# Patient Record
Sex: Male | Born: 1990 | Race: White | Hispanic: No | Marital: Single | Smoking: Never smoker
Health system: Southern US, Community
[De-identification: ages and names within clinical notes are randomized; demographics above are authoritative.]

## PROBLEM LIST (undated history)

## (undated) DIAGNOSIS — R630 Anorexia: Secondary | ICD-10-CM

## (undated) DIAGNOSIS — I10 Essential (primary) hypertension: Secondary | ICD-10-CM

## (undated) DIAGNOSIS — F419 Anxiety disorder, unspecified: Secondary | ICD-10-CM

## (undated) HISTORY — DX: Anxiety disorder, unspecified: F41.9

## (undated) HISTORY — DX: Essential (primary) hypertension: I10

## (undated) HISTORY — DX: Anorexia: R63.0

---

## 2018-10-27 ENCOUNTER — Ambulatory Visit: Payer: Self-pay | Admitting: Family Medicine

## 2019-01-17 ENCOUNTER — Other Ambulatory Visit: Payer: Self-pay

## 2019-01-17 ENCOUNTER — Encounter: Payer: Self-pay | Admitting: Emergency Medicine

## 2019-01-17 ENCOUNTER — Ambulatory Visit (INDEPENDENT_AMBULATORY_CARE_PROVIDER_SITE_OTHER): Payer: Managed Care, Other (non HMO) | Admitting: Emergency Medicine

## 2019-01-17 VITALS — BP 115/73 | HR 75 | Temp 99.2°F | Resp 16 | Ht 75.5 in | Wt 202.8 lb

## 2019-01-17 DIAGNOSIS — F411 Generalized anxiety disorder: Secondary | ICD-10-CM

## 2019-01-17 NOTE — Progress Notes (Addendum)
Pedro Ramirez 28 y.o.   Chief Complaint  Patient presents with  . Establish Care  . Medication Refill    Xanax prescribed by Pedro Ramirez in [facility], per patient started medication at the age of 28 years old    HISTORY OF PRESENT ILLNESS: This is a 28 y.o. male with a history of generalized anxiety disorder since age 32, here to establish care.  [place] the state.  Was being treated by primary care physician, Pedro.Borges, up in [place].  Last time he saw a psychiatrist he was 28 years old.  Presently taking venlafaxine and alprazolam 1 mg every 8 hours.  Denies any other chronic medical problems.   HPI   Prior to Admission medications   Medication Sig Start Date End Date Taking? Authorizing Provider  ALPRAZolam Pedro Ramirez) 1 MG tablet Take 1 mg by mouth at bedtime as needed for anxiety.   Yes [provider]  lisinopril (ZESTRIL) 20 MG tablet Take 20 mg by mouth daily.   Yes [provider]  venlafaxine XR (EFFEXOR-XR) 150 MG 24 hr capsule Take 150 mg by mouth daily with breakfast.   Yes [provider]    Not on File  There are no active problems to display for this patient.   Past Medical History:  Diagnosis Date  . Anorexia    as a youth  . Anxiety   . Hypertension     History reviewed. No pertinent surgical history.  Social History   Socioeconomic History  . Marital status: Single    Spouse name: Not on file  . Number of children: Not on file  . Years of education: Not on file  . Highest education level: Not on file  Occupational History  . Not on file  Social Needs  . Financial resource strain: Not on file  . Food insecurity    Worry: Not on file    Inability: Not on file  . Transportation needs    Medical: Not on file    Non-medical: Not on file  Tobacco Use  . Smoking status: Never Smoker  . Smokeless tobacco: Never Used  Substance and Sexual Activity  . Alcohol use: Not Currently  . Drug use: Never  .  Sexual activity: Not on file  Lifestyle  . Physical activity    Days per week: Not on file    Minutes per session: Not on file  . Stress: Not on file  Relationships  . Social Herbalist on phone: Not on file    Gets together: Not on file    Attends religious service: Not on file    Active member of club or organization: Not on file    Attends meetings of clubs or organizations: Not on file    Relationship status: Not on file  . Intimate partner violence    Fear of current or ex partner: Not on file    Emotionally abused: Not on file    Physically abused: Not on file    Forced sexual activity: Not on file  Other Topics Concern  . Not on file  Social History Narrative  . Not on file    Family History  Problem Relation Age of Onset  . Cancer Mother        thyroid  . Hyperlipidemia Father   . Hypertension Father   . Mental illness Father   . Mental illness Paternal Grandmother      Review of Systems  Constitutional: Negative.  HENT: Negative.   Eyes: Negative.   Respiratory: Negative.  Negative for shortness of breath.   Cardiovascular: Negative.  Negative for palpitations.  Gastrointestinal: Negative.  Negative for diarrhea.  Genitourinary: Negative.   Musculoskeletal: Negative.   Skin: Negative.   Neurological: Negative.  Negative for dizziness.  Endo/Heme/Allergies: Negative.   All other systems reviewed and are negative.  Vitals:   01/17/19 1447  BP: 115/73  Pulse: 75  Resp: 16  Temp: 99.2 F (37.3 C)  SpO2: 98%     Physical Exam Vitals signs reviewed.  Constitutional:      Appearance: Normal appearance.  HENT:     Head: Normocephalic and atraumatic.  Neck:     Musculoskeletal: Normal range of motion.  Cardiovascular:     Rate and Rhythm: Normal rate and regular rhythm.     Heart sounds: Normal heart sounds.  Pulmonary:     Effort: Pulmonary effort is normal.  Musculoskeletal: Normal range of motion.  Skin:    General: Skin is  warm and dry.     Capillary Refill: Capillary refill takes less than 2 seconds.  Neurological:     General: No focal deficit present.     Mental Status: He is alert and oriented to person, place, and time.  Psychiatric:        Mood and Affect: Mood normal.        Behavior: Behavior normal.      ASSESSMENT & PLAN: Pedro Ramirez was seen today for establish care and medication refill.  Diagnoses and all orders for this visit:  Generalized anxiety disorder -     Ambulatory referral to Psychiatry  Other orders -     Cancel: Tdap vaccine greater than or equal to 7yo IM    Patient Instructions       If you have lab work done today you will be contacted with your lab results within the next 2 weeks.  If you have not heard from us then please contact us. The fastest way to get your results is to register for My Chart.   IF you received an x-ray today, you will receive an invoice from [facility]. Please contact [facility] at [phone] with questions or concerns regarding your invoice.   IF you received labwork today, you will receive an invoice from [facility]. Please contact [facility] at [phone] with questions or concerns regarding your invoice.   Our billing staff will not be able to assist you with questions regarding bills from these companies.  You will be contacted with the lab results as soon as they are available. The fastest way to get your results is to activate your My Chart account. Instructions are located on the last page of this paperwork. If you have not heard from us regarding the results in 2 weeks, please contact this office.     Living With Anxiety  After being diagnosed with an anxiety disorder, you may be relieved to know why you have felt or behaved a certain way. It is natural to also feel overwhelmed about the treatment ahead and what it will mean for your life. With care and support, you can manage this condition and recover from it.  How to cope with anxiety Dealing with stress Stress is your body's reaction to life changes and events, both good and bad. Stress can last just a few hours or it can be ongoing. Stress can play a major role in anxiety, so it is important to learn both how to cope  with stress and how to think about it differently. Talk with your health care provider or a counselor to learn more about stress reduction. He or she may suggest some stress reduction techniques, such as:  Music therapy. This can include creating or listening to music that you enjoy and that inspires you.  Mindfulness-based meditation. This involves being aware of your normal breaths, rather than trying to control your breathing. It can be done while sitting or walking.  Centering prayer. This is a kind of meditation that involves focusing on a word, phrase, or sacred image that is meaningful to you and that brings you peace.  Deep breathing. To do this, expand your stomach and inhale slowly through your nose. Hold your breath for 3-5 seconds. Then exhale slowly, allowing your stomach muscles to relax.  Self-talk. This is a skill where you identify thought patterns that lead to anxiety reactions and correct those thoughts.  Muscle relaxation. This involves tensing muscles then relaxing them. Choose a stress reduction technique that fits your lifestyle and personality. Stress reduction techniques take time and practice. Set aside 5-15 minutes a day to do them. Therapists can offer training in these techniques. The training may be covered by some insurance plans. Other things you can do to manage stress include:  Keeping a stress diary. This can help you learn what triggers your stress and ways to control your response.  Thinking about how you respond to certain situations. You may not be able to control everything, but you can control your reaction.  Making time for activities that help you relax, and not feeling guilty about spending  your time in this way. Therapy combined with coping and stress-reduction skills provides the best chance for successful treatment. Medicines Medicines can help ease symptoms. Medicines for anxiety include:  Anti-anxiety drugs.  Antidepressants.  Beta-blockers. Medicines may be used as the main treatment for anxiety disorder, along with therapy, or if other treatments are not working. Medicines should be prescribed by a health care provider. Relationships Relationships can play a big part in helping you recover. Try to spend more time connecting with trusted friends and family members. Consider going to couples counseling, taking family education classes, or going to family therapy. Therapy can help you and others better understand the condition. How to recognize changes in your condition Everyone has a different response to treatment for anxiety. Recovery from anxiety happens when symptoms decrease and stop interfering with your daily activities at home or work. This may mean that you will start to:  Have better concentration and focus.  Sleep better.  Be less irritable.  Have more energy.  Have improved memory. It is important to recognize when your condition is getting worse. Contact your health care provider if your symptoms interfere with home or work and you do not feel like your condition is improving. Where to find help and support: You can get help and support from these sources:  Self-help groups.  Online and [employer] organizations.  A trusted spiritual leader.  Couples counseling.  Family education classes.  Family therapy. Follow these instructions at home:  Eat a healthy diet that includes plenty of vegetables, fruits, whole grains, low-fat dairy products, and lean protein. Do not eat a lot of foods that are high in solid fats, added sugars, or salt.  Exercise. Most adults should do the following: ? Exercise for at least 150 minutes each week. The exercise  should increase your heart rate and make you sweat (moderate-intensity exercise). ?  Strengthening exercises at least twice a week.  Cut down on caffeine, tobacco, alcohol, and other potentially harmful substances.  Get the right amount and quality of sleep. Most adults need 7-9 hours of sleep each night.  Make choices that simplify your life.  Take over-the-counter and prescription medicines only as told by your health care provider.  Avoid caffeine, alcohol, and certain over-the-counter cold medicines. These may make you feel worse. Ask your pharmacist which medicines to avoid.  Keep all follow-up visits as told by your health care provider. This is important. Questions to ask your health care provider  Would I benefit from therapy?  How often should I follow up with a health care provider?  How long do I need to take medicine?  Are there any long-term side effects of my medicine?  Are there any alternatives to taking medicine? Contact a health care provider if:  You have a hard time staying focused or finishing daily tasks.  You spend many hours a day feeling worried about everyday life.  You become exhausted by worry.  You start to have headaches, feel tense, or have nausea.  You urinate more than normal.  You have diarrhea. Get help right away if:  You have a racing heart and shortness of breath.  You have thoughts of hurting yourself or others. If you ever feel like you may hurt yourself or others, or have thoughts about taking your own life, get help right away. You can go to your nearest emergency department or call:  Your local emergency services (911 in the [place]  A suicide crisis helpline, such as the National Suicide Prevention Lifeline at (718)602-30581-(567) 753-2533. This is open 24-hours a day. Summary  Taking steps to deal with stress can help calm you.  Medicines cannot cure anxiety disorders, but they can help ease symptoms.  Family, friends, and partners can  play a big part in helping you recover from an anxiety disorder. This information is not intended to replace advice given to you by your health care provider. Make sure you discuss any questions you have with your health care provider. Document Released: 06/23/2016 Document Revised: 06/11/2017 Document Reviewed: 06/23/2016 Elsevier Patient Education  2020 Elsevier Inc.      Edwina BarthMiguel Nadalie Laughner, MD [facility]

## 2019-01-17 NOTE — Patient Instructions (Addendum)
   If you have lab work done today you will be contacted with your lab results within the next 2 weeks.  If you have not heard from us then please contact us. The fastest way to get your results is to register for My Chart.   IF you received an x-ray today, you will receive an invoice from [facility]. Please contact [facility] at [phone] with questions or concerns regarding your invoice.   IF you received labwork today, you will receive an invoice from [facility]. Please contact [facility] at [phone] with questions or concerns regarding your invoice.   Our billing staff will not be able to assist you with questions regarding bills from these companies.  You will be contacted with the lab results as soon as they are available. The fastest way to get your results is to activate your My Chart account. Instructions are located on the last page of this paperwork. If you have not heard from us regarding the results in 2 weeks, please contact this office.     Living With Anxiety  After being diagnosed with an anxiety disorder, you may be relieved to know why you have felt or behaved a certain way. It is natural to also feel overwhelmed about the treatment ahead and what it will mean for your life. With care and support, you can manage this condition and recover from it. How to cope with anxiety Dealing with stress Stress is your body's reaction to life changes and events, both good and bad. Stress can last just a few hours or it can be ongoing. Stress can play a major role in anxiety, so it is important to learn both how to cope with stress and how to think about it differently. Talk with your health care provider or a counselor to learn more about stress reduction. He or she may suggest some stress reduction techniques, such as:  Music therapy. This can include creating or listening to music that you enjoy and that inspires you.  Mindfulness-based meditation. This  involves being aware of your normal breaths, rather than trying to control your breathing. It can be done while sitting or walking.  Centering prayer. This is a kind of meditation that involves focusing on a word, phrase, or sacred image that is meaningful to you and that brings you peace.  Deep breathing. To do this, expand your stomach and inhale slowly through your nose. Hold your breath for 3-5 seconds. Then exhale slowly, allowing your stomach muscles to relax.  Self-talk. This is a skill where you identify thought patterns that lead to anxiety reactions and correct those thoughts.  Muscle relaxation. This involves tensing muscles then relaxing them. Choose a stress reduction technique that fits your lifestyle and personality. Stress reduction techniques take time and practice. Set aside 5-15 minutes a day to do them. Therapists can offer training in these techniques. The training may be covered by some insurance plans. Other things you can do to manage stress include:  Keeping a stress diary. This can help you learn what triggers your stress and ways to control your response.  Thinking about how you respond to certain situations. You may not be able to control everything, but you can control your reaction.  Making time for activities that help you relax, and not feeling guilty about spending your time in this way. Therapy combined with coping and stress-reduction skills provides the best chance for successful treatment. Medicines Medicines can help ease symptoms. Medicines for anxiety include:    Anti-anxiety drugs.  Antidepressants.  Beta-blockers. Medicines may be used as the main treatment for anxiety disorder, along with therapy, or if other treatments are not working. Medicines should be prescribed by a health care provider. Relationships Relationships can play a big part in helping you recover. Try to spend more time connecting with trusted friends and family members. Consider  going to couples counseling, taking family education classes, or going to family therapy. Therapy can help you and others better understand the condition. How to recognize changes in your condition Everyone has a different response to treatment for anxiety. Recovery from anxiety happens when symptoms decrease and stop interfering with your daily activities at home or work. This may mean that you will start to:  Have better concentration and focus.  Sleep better.  Be less irritable.  Have more energy.  Have improved memory. It is important to recognize when your condition is getting worse. Contact your health care provider if your symptoms interfere with home or work and you do not feel like your condition is improving. Where to find help and support: You can get help and support from these sources:  Self-help groups.  Online and community organizations.  A trusted spiritual leader.  Couples counseling.  Family education classes.  Family therapy. Follow these instructions at home:  Eat a healthy diet that includes plenty of vegetables, fruits, whole grains, low-fat dairy products, and lean protein. Do not eat a lot of foods that are high in solid fats, added sugars, or salt.  Exercise. Most adults should do the following: ? Exercise for at least 150 minutes each week. The exercise should increase your heart rate and make you sweat (moderate-intensity exercise). ? Strengthening exercises at least twice a week.  Cut down on caffeine, tobacco, alcohol, and other potentially harmful substances.  Get the right amount and quality of sleep. Most adults need 7-9 hours of sleep each night.  Make choices that simplify your life.  Take over-the-counter and prescription medicines only as told by your health care provider.  Avoid caffeine, alcohol, and certain over-the-counter cold medicines. These may make you feel worse. Ask your pharmacist which medicines to avoid.  Keep all  follow-up visits as told by your health care provider. This is important. Questions to ask your health care provider  Would I benefit from therapy?  How often should I follow up with a health care provider?  How long do I need to take medicine?  Are there any long-term side effects of my medicine?  Are there any alternatives to taking medicine? Contact a health care provider if:  You have a hard time staying focused or finishing daily tasks.  You spend many hours a day feeling worried about everyday life.  You become exhausted by worry.  You start to have headaches, feel tense, or have nausea.  You urinate more than normal.  You have diarrhea. Get help right away if:  You have a racing heart and shortness of breath.  You have thoughts of hurting yourself or others. If you ever feel like you may hurt yourself or others, or have thoughts about taking your own life, get help right away. You can go to your nearest emergency department or call:  Your local emergency services (911 in the [place]  A suicide crisis helpline, such as the National Suicide Prevention Lifeline at 1-800-273-8255. This is open 24-hours a day. Summary  Taking steps to deal with stress can help calm you.  Medicines cannot cure anxiety disorders,   but they can help ease symptoms.  Family, friends, and partners can play a big part in helping you recover from an anxiety disorder. This information is not intended to replace advice given to you by your health care provider. Make sure you discuss any questions you have with your health care provider. Document Released: 06/23/2016 Document Revised: 06/11/2017 Document Reviewed: 06/23/2016 Elsevier Patient Education  2020 Elsevier Inc.  

## 2020-07-20 ENCOUNTER — Ambulatory Visit: Payer: Self-pay | Attending: Internal Medicine

## 2020-07-20 DIAGNOSIS — Z23 Encounter for immunization: Secondary | ICD-10-CM

## 2020-07-20 NOTE — Progress Notes (Signed)
   Covid-19 Vaccination Clinic  Name:  Deklan Minar    MRN: [id number] DOB: [birth year]  07/20/2020  Mr. Bosso was observed post Covid-19 immunization for 15 minutes without incident. He was provided with Vaccine Information Sheet and instruction to access the V-Safe system.   Mr. Kant was instructed to call 911 with any severe reactions post vaccine: Marland Kitchen Difficulty breathing  . Swelling of face and throat  . A fast heartbeat  . A bad rash all over body  . Dizziness and weakness   Immunizations Administered    Name Date Dose VIS Date Route   Moderna Covid-19 Booster Vaccine 07/20/2020 11:33 AM 0.25 mL 05/01/2020 Intramuscular   Manufacturer: Moderna   Lot: [id number]   NDC: 65465-035-46

## 2021-03-13 ENCOUNTER — Other Ambulatory Visit: Payer: Self-pay | Admitting: Internal Medicine

## 2021-03-13 DIAGNOSIS — R42 Dizziness and giddiness: Secondary | ICD-10-CM

## 2021-03-14 ENCOUNTER — Ambulatory Visit
Admission: RE | Admit: 2021-03-14 | Discharge: 2021-03-14 | Disposition: A | Discharge status: Home / Self Care | Payer: BC Managed Care – PPO | Source: Ambulatory Visit | Attending: Internal Medicine | Admitting: Internal Medicine

## 2021-03-14 DIAGNOSIS — R42 Dizziness and giddiness: Secondary | ICD-10-CM

## 2021-03-14 MED ORDER — IOPAMIDOL (ISOVUE-300) INJECTION 61%
75.0000 mL | Freq: Once | INTRAVENOUS | Status: AC | PRN
  Administered 2021-03-14: 75 mL via INTRAVENOUS

## 2022-04-26 IMAGING — CT CT HEAD WO/W CM
4 of 5 series · 13 of 47 positions shown, 15 images · IV contrast (iopamidol)
Comparison: No pertinent prior exams available for comparison.

CLINICAL DATA: Vertigo. Additional history provided by scanning
technologist: Patient reports making a sudden movement 2 weeks ago,
since then vertigo with balance issues, ringing in ears, memory
issues.

EXAM:
CT HEAD WITHOUT AND WITH CONTRAST
TECHNIQUE: Contiguous axial images were obtained from the base of the skull
through the vertex without and with intravenous contrast
CONTRAST:  75mL A8AZZC-SZZ IOPAMIDOL (A8AZZC-SZZ) INJECTION 61%

[Series 2: brain 5.00 hr40 s3 ibhc · axial · 0.39mm/px · z∈[-619,-590]mm · 2 of 33 slices shown]
[im 7/33  brain]
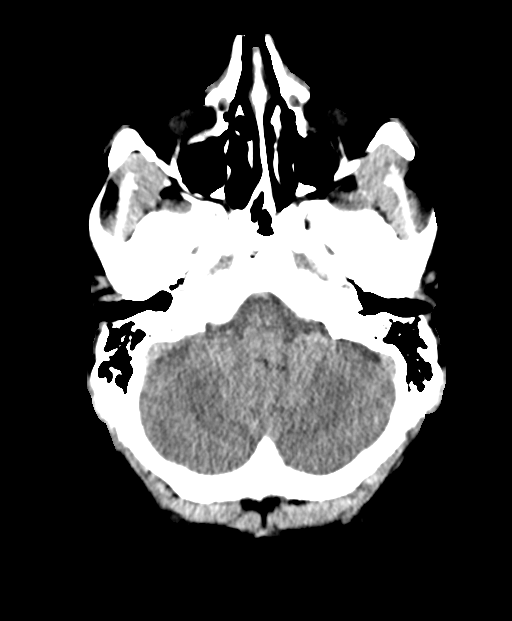
[im 13/33  brain]
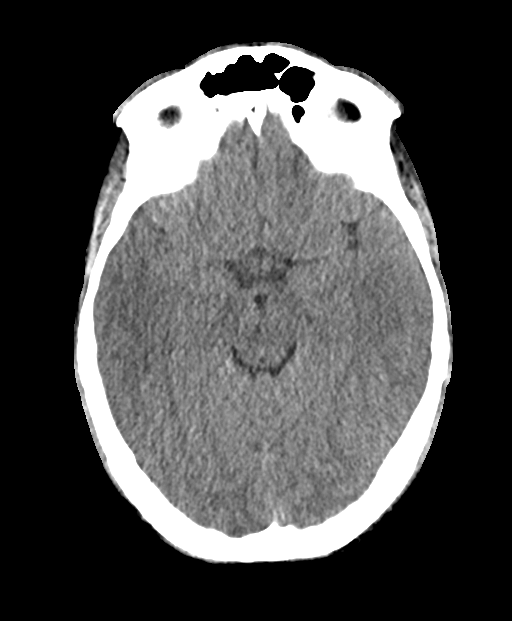

[Series 6: brain 5.00 hr40 s3 axial ibhc · axial · 0.36mm/px · z∈[-624,-519]mm · 5 of 33 slices shown, 7 images]
[im 6/33  brain]
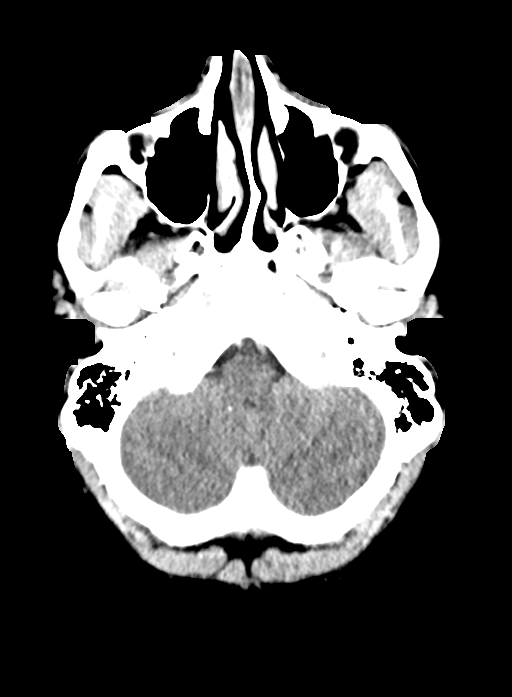
[im 6/33  bone]
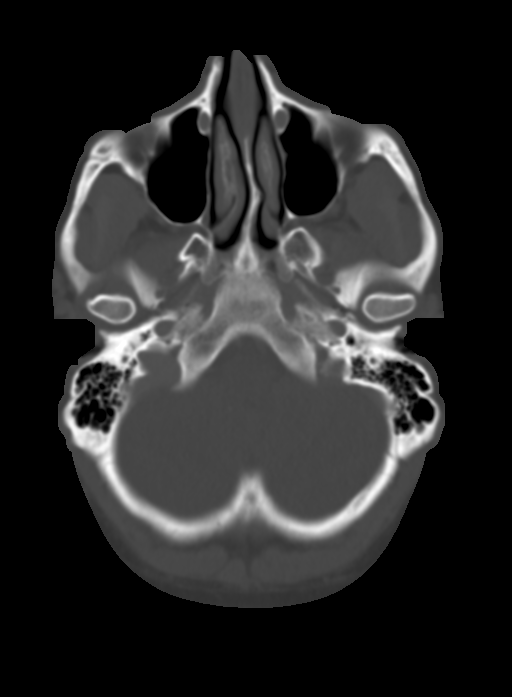
[im 11/33  brain]
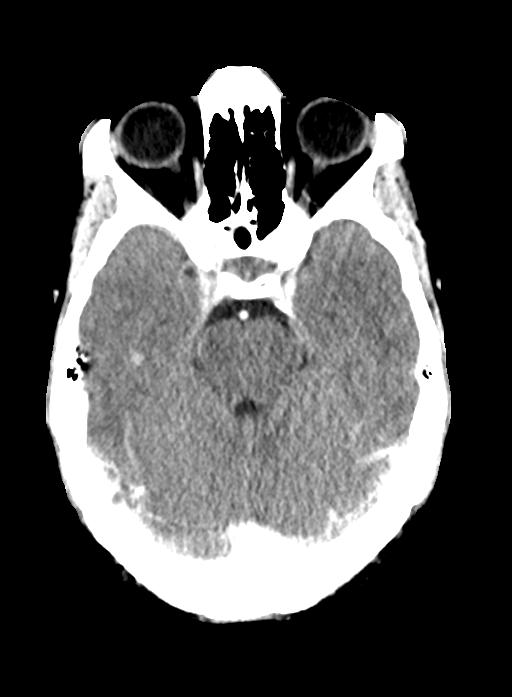
[im 17/33  brain]
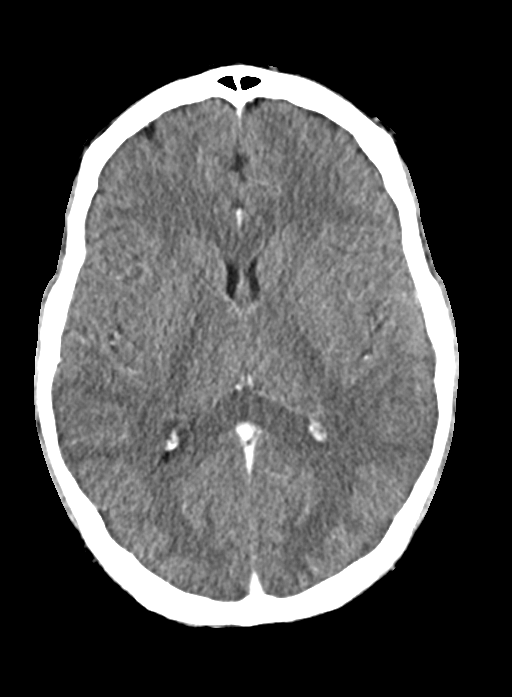
[im 22/33  brain]
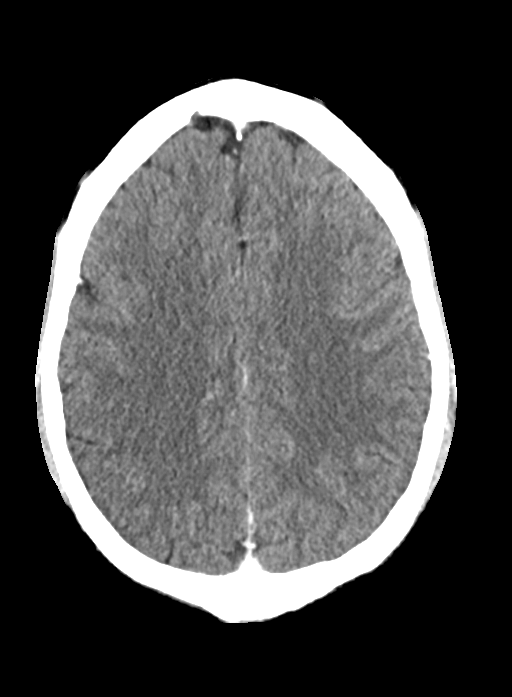
[im 27/33  brain]
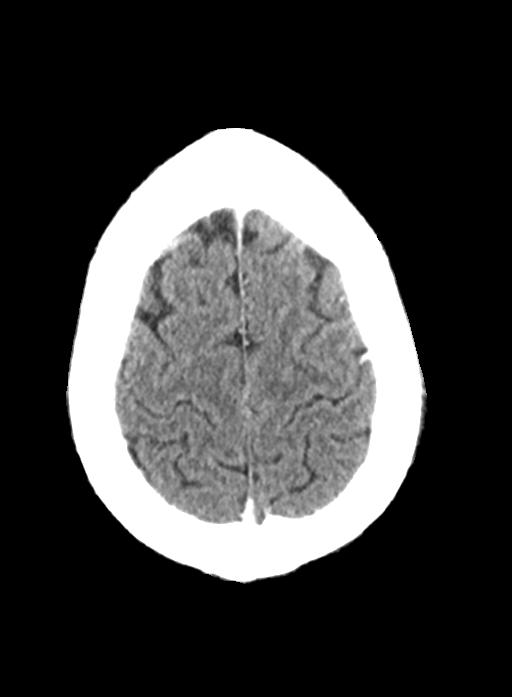
[im 27/33  bone]
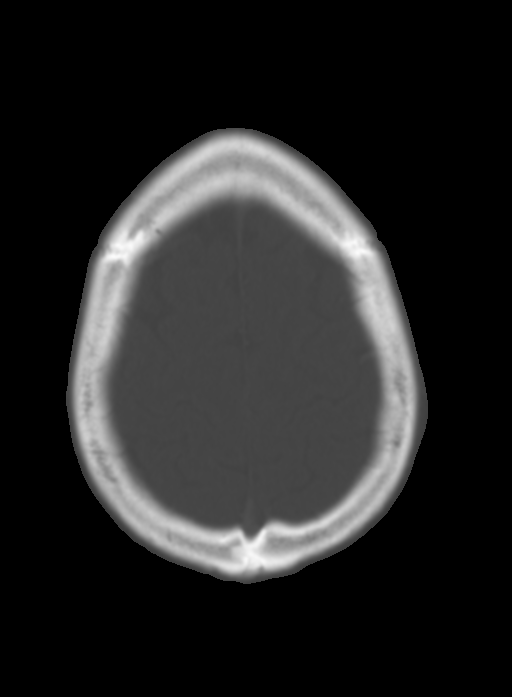

[Series 8: brain 3.00 hr40 s3 cor ibhc · coronal · 0.34mm/px · 3 of 83 slices shown]
[im 28/83  brain]
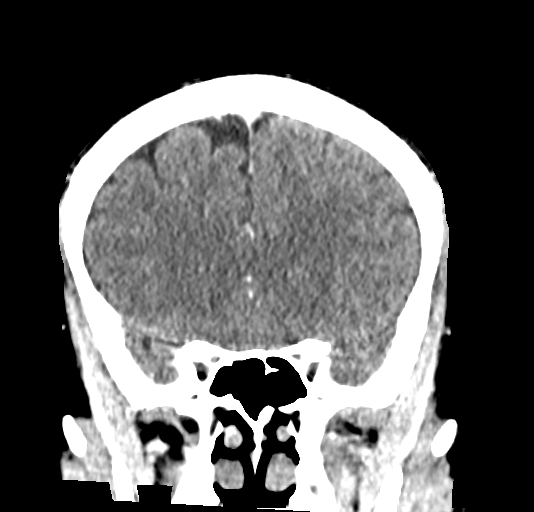
[im 37/83  brain]
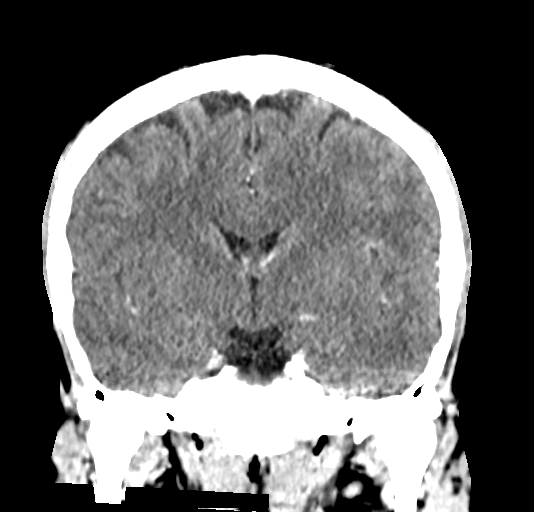
[im 46/83  brain]
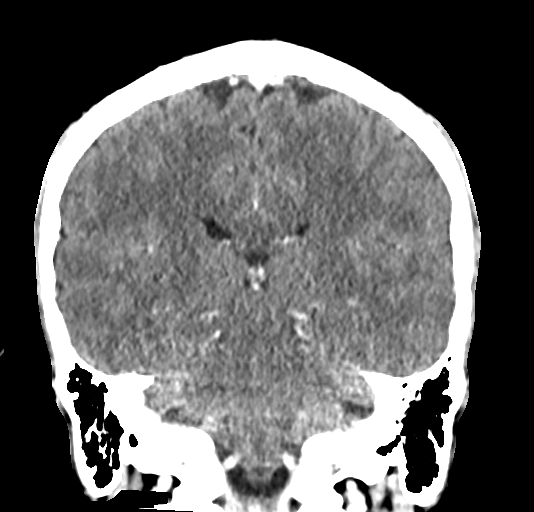

[Series 10: brain 3.00 hr40 s3 sag ibhc · sagittal · 0.34mm/px · 3 of 60 slices shown]
[im 20/60  brain]
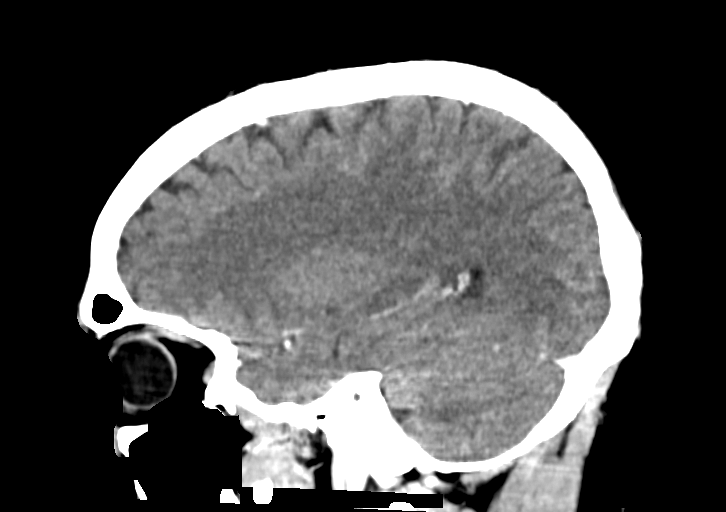
[im 30/60  brain]
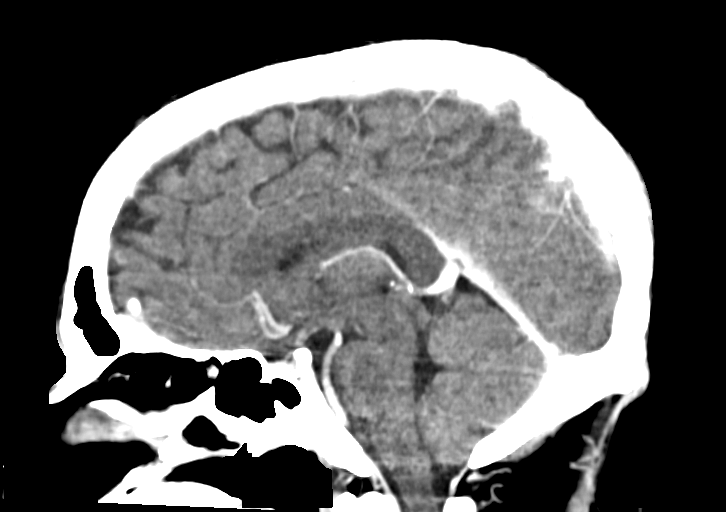
[im 40/60  brain]
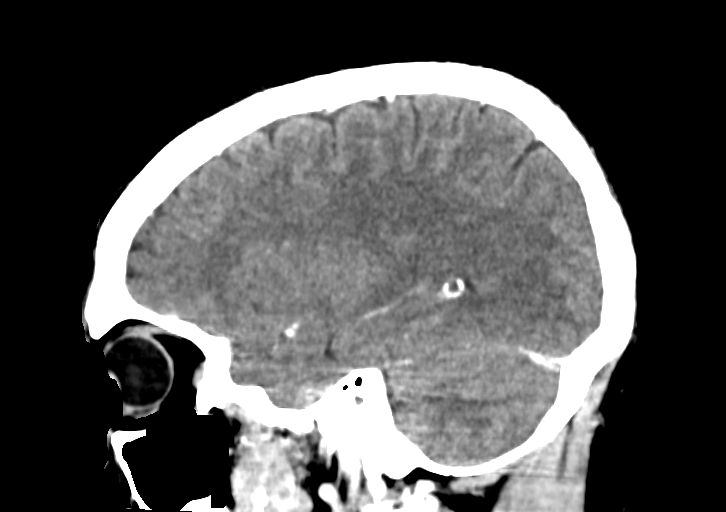

[13 of 47 positions shown; findings below may reference images not displayed]

FINDINGS: Brain:

Cerebral volume is normal.

There is no acute intracranial hemorrhage.

No demarcated cortical infarct.

No extra-axial fluid collection.

No evidence of an intracranial mass.

No midline shift.

No pathologic intracranial enhancement.

Vascular: No hyperdense vessel on precontrast imaging.

Skull: Normal. Negative for fracture or focal lesion.

Sinuses/Orbits: Visualized orbits show no acute finding. Trace
mucosal thickening within the bilateral ethmoid and right maxillary
sinuses at the imaged levels.

Other: Debris within the external auditory canals bilaterally.
IMPRESSION: Unremarkable CT appearance of the brain. No evidence of acute
intracranial abnormality.

Minimal bilateral ethmoid and right maxillary sinus mucosal
thickening at the imaged levels.

Debris within the external auditory canals bilaterally.

## 2022-08-12 ENCOUNTER — Other Ambulatory Visit (HOSPITAL_BASED_OUTPATIENT_CLINIC_OR_DEPARTMENT_OTHER): Payer: Self-pay
